# Patient Record
Sex: Male | Born: 1955
Health system: Southern US, Community
[De-identification: ages and names within clinical notes are randomized; demographics above are authoritative.]

---

## 2000-11-04 ENCOUNTER — Ambulatory Visit (HOSPITAL_BASED_OUTPATIENT_CLINIC_OR_DEPARTMENT_OTHER): Admission: RE | Admit: 2000-11-04 | Discharge: 2000-11-04 | Payer: Self-pay | Admitting: Surgery

## 2003-11-14 ENCOUNTER — Emergency Department (HOSPITAL_COMMUNITY): Admission: EM | Admit: 2003-11-14 | Discharge: 2003-11-15 | Payer: Self-pay | Admitting: *Deleted

## 2013-04-26 ENCOUNTER — Encounter: Payer: Self-pay | Admitting: Podiatry

## 2013-04-26 ENCOUNTER — Ambulatory Visit (INDEPENDENT_AMBULATORY_CARE_PROVIDER_SITE_OTHER): Payer: BC Managed Care – PPO | Admitting: Podiatry

## 2013-04-26 VITALS — BP 162/80 | HR 76 | Resp 12 | Ht 71.0 in | Wt 190.0 lb

## 2013-04-26 DIAGNOSIS — B351 Tinea unguium: Secondary | ICD-10-CM

## 2013-04-26 NOTE — Progress Notes (Signed)
N DISCOLORATION L   B/L FOOT  D  LONG TERM O  SLOWLY C  WORSE A  N/A T  N/A

## 2013-04-26 NOTE — Progress Notes (Signed)
Subjective:     Patient ID: Ethan Roberts, male   DOB: 1956-05-15, 57 y.o.   MRN: [id number]  HPI patient presents stating I have for nails that have yellow discoloration. Points to the big toenails of both feet the third right and the fifth left. States it's been going on for a number of years   Review of Systems  All other systems reviewed and are negative.       Objective:   Physical Exam  Nursing note and vitals reviewed. Constitutional: He is oriented to person, place, and time.  Cardiovascular: Intact distal pulses.   Musculoskeletal: Normal range of motion.  Neurological: He is oriented to person, place, and time.  Skin: Skin is warm.   patient is noted to have nail disease with thickness hallux bilateral Danek disease third right and thickness of the fifth left     Assessment:     Mycotic nail infection x4 nails with probable trauma as initial cause    Plan:     H&P performed and formulas 3 dispensed to patient. Discussed laser treatment and explained the chances of improvement associated with this. Patient wants laser and is scheduled for laser treatment

## 2013-05-03 ENCOUNTER — Encounter: Payer: Self-pay | Admitting: Podiatry

## 2013-05-03 ENCOUNTER — Ambulatory Visit (INDEPENDENT_AMBULATORY_CARE_PROVIDER_SITE_OTHER): Payer: BC Managed Care – PPO | Admitting: Podiatry

## 2013-05-03 VITALS — BP 147/99 | HR 78 | Resp 20 | Ht 71.0 in | Wt 190.0 lb

## 2013-05-03 DIAGNOSIS — B351 Tinea unguium: Secondary | ICD-10-CM

## 2013-05-03 NOTE — Progress Notes (Signed)
Subjective:     Patient ID: Ethan Roberts, male   DOB: 18-Apr-1956, 57 y.o.   MRN: [id number]  HPI patient presents for laser of 4 toenails 2 on each feet   Review of Systems     Objective:   Physical Exam     Assessment:     Mycotic nail infection first fifth left first her right    Plan:     Laser therapy of 4 nails. Approximate 2,200 pulse performed

## 2013-07-12 ENCOUNTER — Ambulatory Visit: Payer: BC Managed Care – PPO | Admitting: Podiatry

## 2013-07-24 ENCOUNTER — Ambulatory Visit (INDEPENDENT_AMBULATORY_CARE_PROVIDER_SITE_OTHER): Payer: BC Managed Care – PPO | Admitting: Podiatry

## 2013-07-24 ENCOUNTER — Encounter: Payer: Self-pay | Admitting: Podiatry

## 2013-07-24 VITALS — BP 137/90 | HR 82 | Resp 16

## 2013-07-24 DIAGNOSIS — B351 Tinea unguium: Secondary | ICD-10-CM

## 2013-07-24 NOTE — Progress Notes (Signed)
Subjective:     Patient ID: Ethan Roberts, male   DOB: 23-Dec-1955, 58 y.o.   MRN: [id number]  HPI I'm here for laser I am doing better   Review of Systems     Objective:   Physical Exam Neurovascular status intact significant diminishment of discoloration big toenail both feet    Assessment:     Improved from laser therapy    Plan:     Continue topical and re\re lasered today with no problems

## 2013-10-05 DIAGNOSIS — B351 Tinea unguium: Secondary | ICD-10-CM

## 2013-11-22 ENCOUNTER — Encounter: Payer: Self-pay | Admitting: Podiatry

## 2013-11-22 ENCOUNTER — Ambulatory Visit (INDEPENDENT_AMBULATORY_CARE_PROVIDER_SITE_OTHER): Payer: BC Managed Care – PPO | Admitting: Podiatry

## 2013-11-22 DIAGNOSIS — B351 Tinea unguium: Secondary | ICD-10-CM

## 2013-11-22 NOTE — Progress Notes (Signed)
Subjective:     Patient ID: Ethan Roberts, male   DOB: [birth year], 58 y.o.   MRN: [id number]  HPI patient states it seems to be doing better   Review of Systems     Objective:   Physical Exam Nails are improving with continued mild discoloration of the right hallux nail distal one half    Assessment:     Improved fungal component with laser    Plan:     Laser to the fifth left hallux bilateral tolerated well

## 2015-05-13 ENCOUNTER — Other Ambulatory Visit: Payer: Self-pay | Admitting: Surgery

## 2015-05-13 NOTE — H&P (Signed)
Brynden L. Thackeray 05/13/2015 8:49 AM Location: [facility] Patient #: [id number] DOB: 1956/05/28 Married / Language: English / Race: White Male  History of Present Illness Adin Hector MD; 05/13/2015 9:22 AM) Patient words: lipoma on back.  The patient is a 59 year old male who presents with a soft tissue mass. Patient sent by his primary care physician, Dr. Antony Contras, for concern of enlarging back mass.  Pleasant active male. Has had a mass on his back for many years. His wife noticed and was concerned. She recommend he consider getting it removed. He discuss with his primary care physician whom offered surgical consultation. Patient denies any history of fall or trauma. He noticed a lump there for almost a decade. He thinks it's Slightly Larger. He Does Exercise with Cardio. He Does Play Golf. Smokes a Couple Cigars a Month at Most. He Had an Umbilical Hernia Repair by Dr Nedra Hai [facility] a few Years Ago. Vasectomy by Dr. Jacqualin Combes with Urology Years Ago. No Other Surgeries. No Lumps or Bumps or Other Problems. He denies any history of fall or trauma. No prior back surgery. No history of skin infections. No abscesses. No infections. No history of neurofibromatosis.   Other Problems Elbert Ewings, CMA; 05/13/2015 8:49 AM) Back Pain Gastroesophageal Reflux Disease Migraine Headache Umbilical Hernia Repair  Past Surgical History Elbert Ewings, CMA; 05/13/2015 8:49 AM) Oral Surgery Ventral / Umbilical Hernia Surgery Right.  Diagnostic Studies History Elbert Ewings, [place]; 05/13/2015 8:49 AM) Colonoscopy 1-5 years ago  Allergies Elbert Ewings, CMA; 05/13/2015 8:49 AM) Penicillin VK *PENICILLINS*  Medication History Elbert Ewings, CMA; 05/13/2015 8:49 AM) Multiple Vitamin (Oral) Active. Ambien CR (12.5MG  Tablet ER, Oral) Active. Medications Reconciled  Social History Elbert Ewings, [place]; 05/13/2015 8:49 AM) Alcohol use Moderate alcohol  use. Caffeine use Coffee. No drug use Tobacco use Current some day smoker.  Family History Elbert Ewings, [place]; 05/13/2015 8:49 AM) Arthritis Mother. Cancer Father. Cerebrovascular Accident Father. Colon Polyps Father. Depression Father, Mother, Sister. Diabetes Mellitus Mother.     Review of Systems Elbert Ewings CMA; 05/13/2015 8:49 AM) General Not Present- Appetite Loss, Chills, Fatigue, Fever, Night Sweats, Weight Gain and Weight Loss. Skin Not Present- Change in Wart/Mole, Dryness, Hives, Jaundice, New Lesions, Non-Healing Wounds, Rash and Ulcer. HEENT Present- Ringing in the Ears. Not Present- Earache, Hearing Loss, Hoarseness, Nose Bleed, Oral Ulcers, Seasonal Allergies, Sinus Pain, Sore Throat, Visual Disturbances, Wears glasses/contact lenses and Yellow Eyes. Respiratory Present- Snoring. Not Present- Bloody sputum, Chronic Cough, Difficulty Breathing and Wheezing. Breast Not Present- Breast Mass, Breast Pain, Nipple Discharge and Skin Changes. Cardiovascular Not Present- Chest Pain, Difficulty Breathing Lying Down, Leg Cramps, Palpitations, Rapid Heart Rate, Shortness of Breath and Swelling of Extremities. Gastrointestinal Not Present- Abdominal Pain, Bloating, Bloody Stool, Change in Bowel Habits, Chronic diarrhea, Constipation, Difficulty Swallowing, Excessive gas, Gets full quickly at meals, Hemorrhoids, Indigestion, Nausea, Rectal Pain and Vomiting. Musculoskeletal Not Present- Back Pain, Joint Pain, Joint Stiffness, Muscle Pain, Muscle Weakness and Swelling of Extremities. Neurological Not Present- Decreased Memory, Fainting, Headaches, Numbness, Seizures, Tingling, Tremor, Trouble walking and Weakness. Psychiatric Not Present- Anxiety, Bipolar, Change in Sleep Pattern, Depression, Fearful and Frequent crying. Endocrine Not Present- Cold Intolerance, Excessive Hunger, Hair Changes, Heat Intolerance, Hot flashes and New Diabetes. Hematology Not Present- Easy Bruising,  Excessive bleeding, Gland problems, HIV and Persistent Infections.  Vitals Elbert Ewings CMA; 05/13/2015 8:50 AM) 05/13/2015 8:50 AM Weight: 205 lb Height: 71in Body Surface Area: 2.13 m Body Mass Index: 28.59  kg/m  Temp.: 98.8F(Temporal)  Pulse: 80 (Regular)  BP: 130/70 (Sitting, Left Arm, Standard)      Physical Exam Adin Hector MD; 05/13/2015 9:15 AM)  General Mental Status-Alert. General Appearance-Not in acute distress, Not Sickly. Orientation-Oriented X3. Hydration-Well hydrated. Voice-Normal.  Integumentary Global Assessment Upon inspection and palpation of skin surfaces of the - Axillae: non-tender, no inflammation or ulceration, no drainage. and Distribution of scalp and body hair is normal. General Characteristics Temperature - normal warmth is noted.  Head and Neck Head-normocephalic, atraumatic with no lesions or palpable masses. Face Global Assessment - atraumatic, no absence of expression. Neck Global Assessment - no abnormal movements, no bruit auscultated on the right, no bruit auscultated on the left, no decreased range of motion, non-tender. Trachea-midline. Thyroid Gland Characteristics - non-tender.  Eye Eyeball - Left-Extraocular movements intact, No Nystagmus. Eyeball - Right-Extraocular movements intact, No Nystagmus. Cornea - Left-No Hazy. Cornea - Right-No Hazy. Sclera/Conjunctiva - Left-No scleral icterus, No Discharge. Sclera/Conjunctiva - Right-No scleral icterus, No Discharge. Pupil - Left-Direct reaction to light normal. Pupil - Right-Direct reaction to light normal.  ENMT Ears Pinna - Left - no drainage observed, no generalized tenderness observed. Right - no drainage observed, no generalized tenderness observed. Nose and Sinuses External Inspection of the Nose - no destructive lesion observed. Inspection of the nares - Left - quiet respiration. Right - quiet respiration. Mouth and  Throat Lips - Upper Lip - no fissures observed, no pallor noted. Lower Lip - no fissures observed, no pallor noted. Nasopharynx - no discharge present. Oral Cavity/Oropharynx - Tongue - no dryness observed. Oral Mucosa - no cyanosis observed. Hypopharynx - no evidence of airway distress observed.  Chest and Lung Exam Inspection Movements - Normal and Symmetrical. Accessory muscles - No use of accessory muscles in breathing. Palpation Palpation of the chest reveals - Non-tender. Auscultation Breath sounds - Normal and Clear.  Cardiovascular Auscultation Rhythm - Regular. Murmurs & Other Heart Sounds - Auscultation of the heart reveals - No Murmurs and No Systolic Clicks.  Abdomen Inspection Inspection of the abdomen reveals - No Visible peristalsis and No Abnormal pulsations. Umbilicus - No Bleeding, No Urine drainage. Palpation/Percussion Palpation and Percussion of the abdomen reveal - Soft, Non Tender, No Rebound tenderness, No Rigidity (guarding) and No Cutaneous hyperesthesia.  Male Genitourinary Sexual Maturity Tanner 5 - Adult hair pattern and Adult penile size and shape.  Peripheral Vascular Upper Extremity Inspection - Left - No Cyanotic nailbeds, Not Ischemic. Right - No Cyanotic nailbeds, Not Ischemic.  Neurologic Neurologic evaluation reveals -normal attention span and ability to concentrate, able to name objects and repeat phrases. Appropriate fund of knowledge , normal sensation and normal coordination. Mental Status Affect - not angry, not paranoid. Cranial Nerves-Normal Bilaterally. Gait-Normal.  Neuropsychiatric Mental status exam performed with findings of-able to articulate well with normal speech/language, rate, volume and coherence, thought content normal with ability to perform basic computations and apply abstract reasoning and no evidence of hallucinations, delusions, obsessions or homicidal/suicidal ideation.  Musculoskeletal Global  Assessment Spine, Ribs and Pelvis - no instability, subluxation or laxity. Right Upper Extremity - no instability, subluxation or laxity. Note: 5x4cm ellipsoid soft tissue mass in LEFT paramedian thoracic back. No opening. No fluctuance. No pain or tenderness.  Lymphatic Head & Neck  General Head & Neck Lymphatics: Bilateral - Description - No Localized lymphadenopathy. Axillary  General Axillary Region: Bilateral - Description - No Localized lymphadenopathy. Femoral & Inguinal  Generalized Femoral & Inguinal Lymphatics: Left - Description -  No Localized lymphadenopathy. Right - Description - No Localized lymphadenopathy.    Assessment & Plan Adin Hector MD; 05/13/2015 9:22 AM)  MASS OF SUBCUTANEOUS TISSUE OF BACK (R22.2) Impression: Slowly enlarging mass of subcutaneous tissues of back. I favor lipoma over cyst. It has slightly gotten larger over the years.  Because of its change in size, I offered removal. My suspicion of malignancy is relatively low but never zero. He did confess that that was a concern for him. Because of his wife's concern in his conservative change in size, he wishes to proceed with excision. Given the location of one at least some sedation a decubitus position given its fixed nature on the back. Should be outpatient surgery. Possibility of drain needed post-op but hopefully not too likely given the fact its less than 5 cm.  Current Plans You are being scheduled for surgery - Our schedulers will call you.  You should hear from our office's scheduling department within 5 working days about the location, date, and time of surgery. We try to make accommodations for patient's preferences in scheduling surgery, but sometimes the OR schedule or the surgeon's schedule prevents [place] from making those accommodations.  If you have not heard from our office [phone]) in 5 working days, call the office and ask for your surgeon's nurse.  If you have other questions  about your diagnosis, plan, or surgery, call the office and ask for your surgeon's nurse.  The pathophysiology of skin & subcutaneous masses was discussed. Natural history risks without surgery were discussed. I recommended surgery to remove the mass. I explained the technique of removal with use of local anesthesia & possible need for more aggressive sedation/anesthesia for patient comfort.  Risks such as bleeding, infection, wound breakdown, heart attack, death, and other risks were discussed. I noted a good likelihood this will help address the problem. Possibility that this will not correct all symptoms was explained. Possibility of regrowth/recurrence of the mass was discussed. We will work to minimize complications. Questions were answered. The patient expresses understanding & wishes to proceed with surgery.  Pt Education - [facility] General Post-op HCI Pt Education - [facility] - General recommendations  Adin Hector, M.D., F.A.C.S. Gastrointestinal and Minimally Invasive Surgery [facility] [address] [place], [place] [address] [phone] Main / Paging

## 2015-11-11 DIAGNOSIS — J309 Allergic rhinitis, unspecified: Secondary | ICD-10-CM | POA: Diagnosis not present

## 2015-11-11 DIAGNOSIS — R05 Cough: Secondary | ICD-10-CM | POA: Diagnosis not present

## 2015-12-23 DIAGNOSIS — L565 Disseminated superficial actinic porokeratosis (DSAP): Secondary | ICD-10-CM | POA: Diagnosis not present

## 2015-12-23 DIAGNOSIS — L570 Actinic keratosis: Secondary | ICD-10-CM | POA: Diagnosis not present

## 2015-12-23 DIAGNOSIS — D1801 Hemangioma of skin and subcutaneous tissue: Secondary | ICD-10-CM | POA: Diagnosis not present

## 2015-12-23 DIAGNOSIS — D3612 Benign neoplasm of peripheral nerves and autonomic nervous system, upper limb, including shoulder: Secondary | ICD-10-CM | POA: Diagnosis not present

## 2015-12-23 DIAGNOSIS — L821 Other seborrheic keratosis: Secondary | ICD-10-CM | POA: Diagnosis not present

## 2016-08-04 DIAGNOSIS — Z0000 Encounter for general adult medical examination without abnormal findings: Secondary | ICD-10-CM | POA: Diagnosis not present

## 2016-08-04 DIAGNOSIS — E7800 Pure hypercholesterolemia, unspecified: Secondary | ICD-10-CM | POA: Diagnosis not present

## 2016-08-04 DIAGNOSIS — I10 Essential (primary) hypertension: Secondary | ICD-10-CM | POA: Diagnosis not present

## 2016-08-04 DIAGNOSIS — G4700 Insomnia, unspecified: Secondary | ICD-10-CM | POA: Diagnosis not present

## 2016-08-04 DIAGNOSIS — Z125 Encounter for screening for malignant neoplasm of prostate: Secondary | ICD-10-CM | POA: Diagnosis not present

## 2016-08-04 DIAGNOSIS — R7303 Prediabetes: Secondary | ICD-10-CM | POA: Diagnosis not present

## 2016-08-04 DIAGNOSIS — N529 Male erectile dysfunction, unspecified: Secondary | ICD-10-CM | POA: Diagnosis not present

## 2016-10-30 DIAGNOSIS — D0439 Carcinoma in situ of skin of other parts of face: Secondary | ICD-10-CM | POA: Diagnosis not present

## 2016-10-30 DIAGNOSIS — L821 Other seborrheic keratosis: Secondary | ICD-10-CM | POA: Diagnosis not present

## 2016-10-30 DIAGNOSIS — L570 Actinic keratosis: Secondary | ICD-10-CM | POA: Diagnosis not present

## 2016-11-24 DIAGNOSIS — C44319 Basal cell carcinoma of skin of other parts of face: Secondary | ICD-10-CM | POA: Diagnosis not present

## 2016-11-27 DIAGNOSIS — H524 Presbyopia: Secondary | ICD-10-CM | POA: Diagnosis not present

## 2016-12-29 DIAGNOSIS — L820 Inflamed seborrheic keratosis: Secondary | ICD-10-CM | POA: Diagnosis not present

## 2016-12-29 DIAGNOSIS — D3612 Benign neoplasm of peripheral nerves and autonomic nervous system, upper limb, including shoulder: Secondary | ICD-10-CM | POA: Diagnosis not present

## 2016-12-29 DIAGNOSIS — Z85828 Personal history of other malignant neoplasm of skin: Secondary | ICD-10-CM | POA: Diagnosis not present

## 2016-12-29 DIAGNOSIS — L570 Actinic keratosis: Secondary | ICD-10-CM | POA: Diagnosis not present

## 2016-12-29 DIAGNOSIS — D225 Melanocytic nevi of trunk: Secondary | ICD-10-CM | POA: Diagnosis not present

## 2016-12-29 DIAGNOSIS — D2261 Melanocytic nevi of right upper limb, including shoulder: Secondary | ICD-10-CM | POA: Diagnosis not present

## 2017-02-04 DIAGNOSIS — L570 Actinic keratosis: Secondary | ICD-10-CM | POA: Diagnosis not present

## 2017-02-04 DIAGNOSIS — Z85828 Personal history of other malignant neoplasm of skin: Secondary | ICD-10-CM | POA: Diagnosis not present

## 2017-02-04 DIAGNOSIS — L821 Other seborrheic keratosis: Secondary | ICD-10-CM | POA: Diagnosis not present

## 2017-08-10 DIAGNOSIS — N529 Male erectile dysfunction, unspecified: Secondary | ICD-10-CM | POA: Diagnosis not present

## 2017-08-10 DIAGNOSIS — R7303 Prediabetes: Secondary | ICD-10-CM | POA: Diagnosis not present

## 2017-08-10 DIAGNOSIS — Z23 Encounter for immunization: Secondary | ICD-10-CM | POA: Diagnosis not present

## 2017-08-10 DIAGNOSIS — Z125 Encounter for screening for malignant neoplasm of prostate: Secondary | ICD-10-CM | POA: Diagnosis not present

## 2017-08-10 DIAGNOSIS — G4700 Insomnia, unspecified: Secondary | ICD-10-CM | POA: Diagnosis not present

## 2017-08-10 DIAGNOSIS — Z0000 Encounter for general adult medical examination without abnormal findings: Secondary | ICD-10-CM | POA: Diagnosis not present

## 2017-08-10 DIAGNOSIS — E7800 Pure hypercholesterolemia, unspecified: Secondary | ICD-10-CM | POA: Diagnosis not present

## 2017-08-10 DIAGNOSIS — I10 Essential (primary) hypertension: Secondary | ICD-10-CM | POA: Diagnosis not present

## 2017-12-13 DIAGNOSIS — H5203 Hypermetropia, bilateral: Secondary | ICD-10-CM | POA: Diagnosis not present

## 2018-01-04 DIAGNOSIS — L570 Actinic keratosis: Secondary | ICD-10-CM | POA: Diagnosis not present

## 2018-01-04 DIAGNOSIS — L821 Other seborrheic keratosis: Secondary | ICD-10-CM | POA: Diagnosis not present

## 2018-01-04 DIAGNOSIS — L812 Freckles: Secondary | ICD-10-CM | POA: Diagnosis not present

## 2018-01-04 DIAGNOSIS — L565 Disseminated superficial actinic porokeratosis (DSAP): Secondary | ICD-10-CM | POA: Diagnosis not present

## 2018-01-04 DIAGNOSIS — D225 Melanocytic nevi of trunk: Secondary | ICD-10-CM | POA: Diagnosis not present

## 2018-03-14 DIAGNOSIS — D0321 Melanoma in situ of right ear and external auricular canal: Secondary | ICD-10-CM | POA: Diagnosis not present

## 2018-03-14 DIAGNOSIS — L570 Actinic keratosis: Secondary | ICD-10-CM | POA: Diagnosis not present

## 2018-04-25 DIAGNOSIS — D0321 Melanoma in situ of right ear and external auricular canal: Secondary | ICD-10-CM | POA: Diagnosis not present

## 2018-06-15 DIAGNOSIS — J069 Acute upper respiratory infection, unspecified: Secondary | ICD-10-CM | POA: Diagnosis not present

## 2018-06-15 DIAGNOSIS — J029 Acute pharyngitis, unspecified: Secondary | ICD-10-CM | POA: Diagnosis not present

## 2018-08-16 DIAGNOSIS — R7303 Prediabetes: Secondary | ICD-10-CM | POA: Diagnosis not present

## 2018-08-16 DIAGNOSIS — I10 Essential (primary) hypertension: Secondary | ICD-10-CM | POA: Diagnosis not present

## 2018-08-16 DIAGNOSIS — E7800 Pure hypercholesterolemia, unspecified: Secondary | ICD-10-CM | POA: Diagnosis not present

## 2018-08-16 DIAGNOSIS — Z0000 Encounter for general adult medical examination without abnormal findings: Secondary | ICD-10-CM | POA: Diagnosis not present

## 2018-08-16 DIAGNOSIS — Z23 Encounter for immunization: Secondary | ICD-10-CM | POA: Diagnosis not present

## 2018-08-16 DIAGNOSIS — N529 Male erectile dysfunction, unspecified: Secondary | ICD-10-CM | POA: Diagnosis not present

## 2018-08-16 DIAGNOSIS — G4700 Insomnia, unspecified: Secondary | ICD-10-CM | POA: Diagnosis not present

## 2018-08-16 DIAGNOSIS — Z125 Encounter for screening for malignant neoplasm of prostate: Secondary | ICD-10-CM | POA: Diagnosis not present

## 2018-08-24 DIAGNOSIS — R6889 Other general symptoms and signs: Secondary | ICD-10-CM | POA: Diagnosis not present

## 2018-08-24 DIAGNOSIS — J111 Influenza due to unidentified influenza virus with other respiratory manifestations: Secondary | ICD-10-CM | POA: Diagnosis not present

## 2018-08-24 DIAGNOSIS — J029 Acute pharyngitis, unspecified: Secondary | ICD-10-CM | POA: Diagnosis not present

## 2018-11-03 DIAGNOSIS — L812 Freckles: Secondary | ICD-10-CM | POA: Diagnosis not present

## 2018-11-03 DIAGNOSIS — D1801 Hemangioma of skin and subcutaneous tissue: Secondary | ICD-10-CM | POA: Diagnosis not present

## 2018-11-03 DIAGNOSIS — D225 Melanocytic nevi of trunk: Secondary | ICD-10-CM | POA: Diagnosis not present

## 2018-11-03 DIAGNOSIS — L570 Actinic keratosis: Secondary | ICD-10-CM | POA: Diagnosis not present

## 2019-03-01 DIAGNOSIS — H524 Presbyopia: Secondary | ICD-10-CM | POA: Diagnosis not present

## 2019-03-01 DIAGNOSIS — H5203 Hypermetropia, bilateral: Secondary | ICD-10-CM | POA: Diagnosis not present

## 2019-03-01 DIAGNOSIS — H52203 Unspecified astigmatism, bilateral: Secondary | ICD-10-CM | POA: Diagnosis not present

## 2019-04-12 ENCOUNTER — Other Ambulatory Visit: Payer: Self-pay

## 2019-04-12 DIAGNOSIS — Z20822 Contact with and (suspected) exposure to covid-19: Secondary | ICD-10-CM

## 2019-04-13 LAB — NOVEL CORONAVIRUS, NAA: SARS-CoV-2, NAA: NOT DETECTED

## 2019-05-10 DIAGNOSIS — L570 Actinic keratosis: Secondary | ICD-10-CM | POA: Diagnosis not present

## 2019-05-10 DIAGNOSIS — D2261 Melanocytic nevi of right upper limb, including shoulder: Secondary | ICD-10-CM | POA: Diagnosis not present

## 2019-05-10 DIAGNOSIS — D225 Melanocytic nevi of trunk: Secondary | ICD-10-CM | POA: Diagnosis not present

## 2019-05-10 DIAGNOSIS — L812 Freckles: Secondary | ICD-10-CM | POA: Diagnosis not present

## 2019-05-10 DIAGNOSIS — D2371 Other benign neoplasm of skin of right lower limb, including hip: Secondary | ICD-10-CM | POA: Diagnosis not present

## 2019-06-30 DIAGNOSIS — Z20822 Contact with and (suspected) exposure to covid-19: Secondary | ICD-10-CM | POA: Diagnosis not present

## 2019-09-18 DIAGNOSIS — N529 Male erectile dysfunction, unspecified: Secondary | ICD-10-CM | POA: Diagnosis not present

## 2019-09-18 DIAGNOSIS — Z125 Encounter for screening for malignant neoplasm of prostate: Secondary | ICD-10-CM | POA: Diagnosis not present

## 2019-09-18 DIAGNOSIS — I10 Essential (primary) hypertension: Secondary | ICD-10-CM | POA: Diagnosis not present

## 2019-09-18 DIAGNOSIS — R7303 Prediabetes: Secondary | ICD-10-CM | POA: Diagnosis not present

## 2019-09-18 DIAGNOSIS — G4700 Insomnia, unspecified: Secondary | ICD-10-CM | POA: Diagnosis not present

## 2019-09-18 DIAGNOSIS — E7800 Pure hypercholesterolemia, unspecified: Secondary | ICD-10-CM | POA: Diagnosis not present

## 2019-09-18 DIAGNOSIS — Z0000 Encounter for general adult medical examination without abnormal findings: Secondary | ICD-10-CM | POA: Diagnosis not present

## 2019-09-21 DIAGNOSIS — E875 Hyperkalemia: Secondary | ICD-10-CM | POA: Diagnosis not present

## 2019-10-24 DIAGNOSIS — Z23 Encounter for immunization: Secondary | ICD-10-CM | POA: Diagnosis not present

## 2019-11-08 DIAGNOSIS — L820 Inflamed seborrheic keratosis: Secondary | ICD-10-CM | POA: Diagnosis not present

## 2019-11-08 DIAGNOSIS — D2261 Melanocytic nevi of right upper limb, including shoulder: Secondary | ICD-10-CM | POA: Diagnosis not present

## 2019-11-08 DIAGNOSIS — D225 Melanocytic nevi of trunk: Secondary | ICD-10-CM | POA: Diagnosis not present

## 2019-11-08 DIAGNOSIS — D2372 Other benign neoplasm of skin of left lower limb, including hip: Secondary | ICD-10-CM | POA: Diagnosis not present

## 2019-11-08 DIAGNOSIS — L570 Actinic keratosis: Secondary | ICD-10-CM | POA: Diagnosis not present

## 2019-11-08 DIAGNOSIS — D2262 Melanocytic nevi of left upper limb, including shoulder: Secondary | ICD-10-CM | POA: Diagnosis not present

## 2020-02-23 DIAGNOSIS — Z23 Encounter for immunization: Secondary | ICD-10-CM | POA: Diagnosis not present

## 2020-03-28 DIAGNOSIS — Z20828 Contact with and (suspected) exposure to other viral communicable diseases: Secondary | ICD-10-CM | POA: Diagnosis not present

## 2020-04-05 ENCOUNTER — Other Ambulatory Visit: Payer: Self-pay

## 2020-04-05 ENCOUNTER — Encounter (INDEPENDENT_AMBULATORY_CARE_PROVIDER_SITE_OTHER): Payer: Self-pay | Admitting: Otolaryngology

## 2020-04-05 ENCOUNTER — Ambulatory Visit (INDEPENDENT_AMBULATORY_CARE_PROVIDER_SITE_OTHER): Payer: BLUE CROSS/BLUE SHIELD | Admitting: Otolaryngology

## 2020-04-05 VITALS — Temp 97.3°F

## 2020-04-05 DIAGNOSIS — H608X3 Other otitis externa, bilateral: Secondary | ICD-10-CM | POA: Diagnosis not present

## 2020-04-05 NOTE — Progress Notes (Signed)
HPI: Ethan Roberts is a 64 y.o. male who presents for evaluation of itching and scaling in his ears for the past 1-1/2 to 2 months.  He has tried Neosporin.  He has drainage sometimes.  The ears have been itching.  He has not noted any hearing problems.  He is having some cracking of the skin with little bit of soreness on the right side.  No past medical history on file. No past surgical history on file. Social History   Socioeconomic History   Marital status: Married    Spouse name: Not on file   Number of children: Not on file   Years of education: Not on file   Highest education level: Not on file  Occupational History   Not on file  Tobacco Use   Smoking status: Current Some Day Smoker    Types: Cigars   Smokeless tobacco: Never Used   Tobacco comment: 1 cigar a week  Substance and Sexual Activity   Alcohol use: Yes    Comment: a glass of wine 4 out of 7 days   Drug use: No   Sexual activity: Not on file  Other Topics Concern   Not on file  Social History Narrative   Not on file   Social Determinants of Health   Financial Resource Strain:    Difficulty of Paying Living Expenses: Not on file  Food Insecurity:    Worried About [place] in the Last Year: Not on file   Ran Out of Food in the Last Year: Not on file  Transportation Needs:    Lack of Transportation (Medical): Not on file   Lack of Transportation (Non-Medical): Not on file  Physical Activity:    Days of Exercise per Week: Not on file   Minutes of Exercise per Session: Not on file  Stress:    Feeling of Stress : Not on file  Social Connections:    Frequency of Communication with Friends and Family: Not on file   Frequency of Social Gatherings with Friends and Family: Not on file   Attends Religious Services: Not on file   Active Member of Clubs or Organizations: Not on file   Attends Archivist Meetings: Not on file   Marital Status: Not on file   No  family history on file. Allergies  Allergen Reactions   Penicillins    Prior to Admission medications   Medication Sig Start Date End Date Taking? Authorizing Provider  Multiple Vitamin (MULTIVITAMIN) tablet Take 1 tablet by mouth daily.   Yes [provider]  zolpidem (AMBIEN CR) 12.5 MG CR tablet  04/04/13  Yes [provider]     Positive ROS: Otherwise negative  All other systems have been reviewed and were otherwise negative with the exception of those mentioned in the HPI and as above.  Physical Exam: Constitutional: Alert, well-appearing, no acute distress Ears: External ears without lesions or tenderness.  The outer and lateral portion of both ear canals reveal crusting but no drainage.  He has some cracking of the skin on the right side.  The more medial portion of the ear canals are clear with no drainage and no evidence of external otitis medial within the ear canal.  The TMs are clear bilaterally.  I applied gentian violet Ciprodex and CSF powder to the lateral portion of both ear canals. Nasal: External nose without lesions. Clear nasal passages Oral: Lips and gums without lesions. Tongue and palate mucosa without lesions.  Posterior oropharynx clear. Neck: No palpable adenopathy or masses Respiratory: Breathing comfortably  Skin: No facial/neck lesions or rash noted.  Procedures  Assessment: Eczematoid type changes of the lateral ear canal on both sides.  Extends into the concha.  Plan: Recommend keeping it dry tonight in prescribed Diprolene 0.05% cream to apply to the area twice daily for a week.  Also gave her a prescription for mupirocin 2% ointment to use if he develops any pain.  He will notify us if symptoms are not significantly improved within a week.  Radene Journey, MD

## 2020-05-09 DIAGNOSIS — D225 Melanocytic nevi of trunk: Secondary | ICD-10-CM | POA: Diagnosis not present

## 2020-05-09 DIAGNOSIS — D1801 Hemangioma of skin and subcutaneous tissue: Secondary | ICD-10-CM | POA: Diagnosis not present

## 2020-05-09 DIAGNOSIS — L570 Actinic keratosis: Secondary | ICD-10-CM | POA: Diagnosis not present

## 2020-05-09 DIAGNOSIS — L812 Freckles: Secondary | ICD-10-CM | POA: Diagnosis not present

## 2020-05-09 DIAGNOSIS — L245 Irritant contact dermatitis due to other chemical products: Secondary | ICD-10-CM | POA: Diagnosis not present

## 2020-09-18 DIAGNOSIS — L308 Other specified dermatitis: Secondary | ICD-10-CM | POA: Diagnosis not present

## 2020-10-03 DIAGNOSIS — Z0000 Encounter for general adult medical examination without abnormal findings: Secondary | ICD-10-CM | POA: Diagnosis not present

## 2020-10-03 DIAGNOSIS — Z125 Encounter for screening for malignant neoplasm of prostate: Secondary | ICD-10-CM | POA: Diagnosis not present

## 2020-10-03 DIAGNOSIS — R7303 Prediabetes: Secondary | ICD-10-CM | POA: Diagnosis not present

## 2020-10-03 DIAGNOSIS — E7800 Pure hypercholesterolemia, unspecified: Secondary | ICD-10-CM | POA: Diagnosis not present

## 2020-10-03 DIAGNOSIS — I10 Essential (primary) hypertension: Secondary | ICD-10-CM | POA: Diagnosis not present

## 2020-10-03 DIAGNOSIS — G4700 Insomnia, unspecified: Secondary | ICD-10-CM | POA: Diagnosis not present

## 2020-10-03 DIAGNOSIS — N529 Male erectile dysfunction, unspecified: Secondary | ICD-10-CM | POA: Diagnosis not present

## 2020-11-19 DIAGNOSIS — Z85828 Personal history of other malignant neoplasm of skin: Secondary | ICD-10-CM | POA: Diagnosis not present

## 2020-11-19 DIAGNOSIS — L820 Inflamed seborrheic keratosis: Secondary | ICD-10-CM | POA: Diagnosis not present

## 2020-11-19 DIAGNOSIS — L821 Other seborrheic keratosis: Secondary | ICD-10-CM | POA: Diagnosis not present

## 2020-11-19 DIAGNOSIS — Z85820 Personal history of malignant melanoma of skin: Secondary | ICD-10-CM | POA: Diagnosis not present

## 2020-11-19 DIAGNOSIS — D2261 Melanocytic nevi of right upper limb, including shoulder: Secondary | ICD-10-CM | POA: Diagnosis not present

## 2020-11-21 DIAGNOSIS — H25013 Cortical age-related cataract, bilateral: Secondary | ICD-10-CM | POA: Diagnosis not present

## 2020-11-21 DIAGNOSIS — H524 Presbyopia: Secondary | ICD-10-CM | POA: Diagnosis not present

## 2020-11-21 DIAGNOSIS — R7303 Prediabetes: Secondary | ICD-10-CM | POA: Diagnosis not present

## 2020-11-21 DIAGNOSIS — H2513 Age-related nuclear cataract, bilateral: Secondary | ICD-10-CM | POA: Diagnosis not present

## 2020-11-21 DIAGNOSIS — H5203 Hypermetropia, bilateral: Secondary | ICD-10-CM | POA: Diagnosis not present

## 2020-11-21 DIAGNOSIS — H52201 Unspecified astigmatism, right eye: Secondary | ICD-10-CM | POA: Diagnosis not present

## 2020-12-24 DIAGNOSIS — L820 Inflamed seborrheic keratosis: Secondary | ICD-10-CM | POA: Diagnosis not present

## 2021-01-03 DIAGNOSIS — K648 Other hemorrhoids: Secondary | ICD-10-CM | POA: Diagnosis not present

## 2021-01-03 DIAGNOSIS — Z8371 Family history of colonic polyps: Secondary | ICD-10-CM | POA: Diagnosis not present

## 2021-01-03 DIAGNOSIS — Z1211 Encounter for screening for malignant neoplasm of colon: Secondary | ICD-10-CM | POA: Diagnosis not present

## 2021-05-27 DIAGNOSIS — L812 Freckles: Secondary | ICD-10-CM | POA: Diagnosis not present

## 2021-05-27 DIAGNOSIS — D225 Melanocytic nevi of trunk: Secondary | ICD-10-CM | POA: Diagnosis not present

## 2021-05-27 DIAGNOSIS — L820 Inflamed seborrheic keratosis: Secondary | ICD-10-CM | POA: Diagnosis not present

## 2021-05-27 DIAGNOSIS — L821 Other seborrheic keratosis: Secondary | ICD-10-CM | POA: Diagnosis not present

## 2021-10-22 ENCOUNTER — Encounter (HOSPITAL_BASED_OUTPATIENT_CLINIC_OR_DEPARTMENT_OTHER): Payer: Self-pay | Admitting: Emergency Medicine

## 2021-10-22 ENCOUNTER — Emergency Department (HOSPITAL_BASED_OUTPATIENT_CLINIC_OR_DEPARTMENT_OTHER)
Admission: EM | Admit: 2021-10-22 | Discharge: 2021-10-23 | Disposition: A | Discharge status: Home / Self Care | Payer: Commercial Managed Care - PPO | Attending: Emergency Medicine | Admitting: Emergency Medicine

## 2021-10-22 ENCOUNTER — Other Ambulatory Visit: Payer: Self-pay

## 2021-10-22 ENCOUNTER — Emergency Department (HOSPITAL_BASED_OUTPATIENT_CLINIC_OR_DEPARTMENT_OTHER): Payer: Commercial Managed Care - PPO

## 2021-10-22 DIAGNOSIS — R1032 Left lower quadrant pain: Secondary | ICD-10-CM | POA: Diagnosis present

## 2021-10-22 DIAGNOSIS — D72829 Elevated white blood cell count, unspecified: Secondary | ICD-10-CM | POA: Diagnosis not present

## 2021-10-22 DIAGNOSIS — K529 Noninfective gastroenteritis and colitis, unspecified: Secondary | ICD-10-CM | POA: Insufficient documentation

## 2021-10-22 LAB — URINALYSIS, ROUTINE W REFLEX MICROSCOPIC
Bilirubin Urine: NEGATIVE
Glucose, UA: NEGATIVE mg/dL
Hgb urine dipstick: NEGATIVE
Ketones, ur: 40 mg/dL — AB
Leukocytes,Ua: NEGATIVE
Nitrite: NEGATIVE
Protein, ur: NEGATIVE mg/dL
Specific Gravity, Urine: <=1.005 (ref 1.005–1.030)
pH: 6.5 (ref 5.0–8.0)

## 2021-10-22 LAB — CBC WITH DIFFERENTIAL/PLATELET
Abs Immature Granulocytes: 0.06 10*3/uL (ref 0.00–0.07)
Basophils Absolute: 0.1 10*3/uL (ref 0.0–0.1)
Basophils Relative: 1 %
Eosinophils Absolute: 0.1 10*3/uL (ref 0.0–0.5)
Eosinophils Relative: 1 %
HCT: 47.2 % (ref 39.0–52.0)
Hemoglobin: 16.3 g/dL (ref 13.0–17.0)
Immature Granulocytes: 1 %
Lymphocytes Relative: 10 %
Lymphs Abs: 1.1 10*3/uL (ref 0.7–4.0)
MCH: 29.7 pg (ref 26.0–34.0)
MCHC: 34.5 g/dL (ref 30.0–36.0)
MCV: 86 fL (ref 80.0–100.0)
Monocytes Absolute: 0.4 10*3/uL (ref 0.1–1.0)
Monocytes Relative: 4 %
Neutro Abs: 9.4 10*3/uL — ABNORMAL HIGH (ref 1.7–7.7)
Neutrophils Relative %: 83 %
Platelets: 359 10*3/uL (ref 150–400)
RBC: 5.49 MIL/uL (ref 4.22–5.81)
RDW: 12.7 % (ref 11.5–15.5)
WBC: 11.2 10*3/uL — ABNORMAL HIGH (ref 4.0–10.5)
nRBC: 0 % (ref 0.0–0.2)

## 2021-10-22 LAB — COMPREHENSIVE METABOLIC PANEL
ALT: 27 U/L (ref 0–44)
AST: 28 U/L (ref 15–41)
Albumin: 4.3 g/dL (ref 3.5–5.0)
Alkaline Phosphatase: 69 U/L (ref 38–126)
Anion gap: 10 (ref 5–15)
BUN: 22 mg/dL (ref 8–23)
CO2: 25 mmol/L (ref 22–32)
Calcium: 9.1 mg/dL (ref 8.9–10.3)
Chloride: 102 mmol/L (ref 98–111)
Creatinine, Ser: 1.07 mg/dL (ref 0.61–1.24)
GFR, Estimated: >60 mL/min (ref >60)
Glucose, Bld: 152 mg/dL — ABNORMAL HIGH (ref 70–99)
Potassium: 4.1 mmol/L (ref 3.5–5.1)
Sodium: 137 mmol/L (ref 135–145)
Total Bilirubin: 0.6 mg/dL (ref 0.3–1.2)
Total Protein: 7.5 g/dL (ref 6.5–8.1)

## 2021-10-22 LAB — LIPASE, BLOOD: Lipase: 27 U/L (ref 11–51)

## 2021-10-22 MED ORDER — KETOROLAC TROMETHAMINE 30 MG/ML IJ SOLN
30.0000 mg | Freq: Once | INTRAMUSCULAR | Status: AC
  Administered 2021-10-23: 30 mg via INTRAVENOUS
  Filled 2021-10-22: qty 1

## 2021-10-22 MED ORDER — SODIUM CHLORIDE 0.9 % IV BOLUS
500.0000 mL | Freq: Once | INTRAVENOUS | Status: AC
  Administered 2021-10-23: 500 mL via INTRAVENOUS

## 2021-10-22 MED ORDER — IOHEXOL 300 MG/ML  SOLN
100.0000 mL | Freq: Once | INTRAMUSCULAR | Status: AC | PRN
  Administered 2021-10-22: 100 mL via INTRAVENOUS

## 2021-10-22 MED ORDER — HYDROMORPHONE HCL 1 MG/ML IJ SOLN
1.0000 mg | Freq: Once | INTRAMUSCULAR | Status: AC
  Administered 2021-10-22: 1 mg via INTRAVENOUS
  Filled 2021-10-22: qty 1

## 2021-10-22 MED ORDER — ONDANSETRON HCL 4 MG/2ML IJ SOLN
4.0000 mg | Freq: Once | INTRAMUSCULAR | Status: AC
  Administered 2021-10-22: 4 mg via INTRAVENOUS
  Filled 2021-10-22: qty 2

## 2021-10-22 NOTE — ED Notes (Signed)
ED Provider at bedside. 

## 2021-10-22 NOTE — ED Triage Notes (Signed)
Patient arrived via POV c/o abdominal pain x 8 hrs. Patient states waves of pain to lower left quadrant from 6-10 in intensity. Patient states 1 episode of diarrhea. Patient is AO x 4, VS with elevated BP, normal gait. ?

## 2021-10-23 ENCOUNTER — Encounter (HOSPITAL_BASED_OUTPATIENT_CLINIC_OR_DEPARTMENT_OTHER): Payer: Self-pay | Admitting: Emergency Medicine

## 2021-10-23 MED ORDER — DICYCLOMINE HCL 20 MG PO TABS: 20.0000 mg | ORAL_TABLET | Freq: Two times a day (BID) | ORAL | 0 refills | Status: DC

## 2021-10-23 MED ORDER — ONDANSETRON 4 MG PO TBDP: ORAL_TABLET | ORAL | 0 refills | Status: DC

## 2021-10-23 NOTE — ED Provider Notes (Signed)
?[facility] ?Provider Note ? ? ?CSN: [id number] ?Arrival date & time: 10/22/21  2023 ? ?  ? ?History ? ?Chief Complaint  ?Patient presents with  ? Abdominal Pain  ? ? ?Ethan Roberts is a 66 y.o. male. ? ?The history is provided by the patient.  ?Abdominal Pain ?Pain location:  LLQ ?Pain quality: cramping   ?Pain radiates to:  Does not radiate ?Pain severity:  Severe ?Onset quality:  Sudden ?Duration: 8 hours. ?Timing:  Constant ?Progression:  Resolved ?Chronicity:  New ?Context: not suspicious food intake and not trauma   ?Relieved by:  Nothing ?Worsened by:  Nothing ?Associated symptoms: diarrhea and nausea   ?Associated symptoms: no dysuria and no fever   ?Risk factors: has not had multiple surgeries   ? ?  ? ?Home Medications ?Prior to Admission medications   ?Medication Sig Start Date End Date Taking? Authorizing Provider  ?dicyclomine (BENTYL) 20 MG tablet Take 1 tablet (20 mg total) by mouth 2 (two) times daily. 10/23/21  Yes Zanita Millman, MD  ?ondansetron (ZOFRAN-ODT) 4 MG disintegrating tablet '4mg'$  ODT q8 hours prn nausea/vomit 10/23/21  Yes Deseree Zemaitis, MD  ?Multiple Vitamin (MULTIVITAMIN) tablet Take 1 tablet by mouth daily.    [provider]  ?zolpidem (AMBIEN CR) 12.5 MG CR tablet  04/04/13   [provider]  ?   ? ?Allergies    ?Penicillins   ? ?Review of Systems   ?Review of Systems  ?Constitutional:  Negative for fever.  ?HENT:  Negative for congestion.   ?Eyes:  Negative for redness.  ?Respiratory:  Negative for wheezing and stridor.   ?Cardiovascular:  Negative for leg swelling.  ?Gastrointestinal:  Positive for abdominal pain, diarrhea and nausea.  ?Genitourinary:  Negative for dysuria.  ?Musculoskeletal:  Negative for neck stiffness.  ?Skin:  Negative for rash.  ?Neurological:  Negative for facial asymmetry.  ?Psychiatric/Behavioral:  Negative for agitation.   ?All other systems reviewed and are negative. ? ?Physical Exam ?Updated Vital Signs ?BP  117/82   Pulse 87   Temp 99.4 ?F (37.4 ?C) (Oral)   Resp 18   Ht '5\' 10"'$  (1.778 m)   Wt 88.5 kg   SpO2 94%   BMI 27.98 kg/m?  ?Physical Exam ?Vitals and nursing note reviewed.  ?Constitutional:   ?   General: He is not in acute distress. ?   Appearance: Normal appearance.  ?HENT:  ?   Head: Normocephalic and atraumatic.  ?   Nose: Nose normal.  ?Eyes:  ?   Conjunctiva/sclera: Conjunctivae normal.  ?   Pupils: Pupils are equal, round, and reactive to light.  ?Cardiovascular:  ?   Rate and Rhythm: Normal rate and regular rhythm.  ?   Pulses: Normal pulses.  ?   Heart sounds: Normal heart sounds.  ?Pulmonary:  ?   Effort: Pulmonary effort is normal.  ?   Breath sounds: Normal breath sounds.  ?Abdominal:  ?   General: Bowel sounds are normal.  ?   Palpations: Abdomen is soft. There is no mass.  ?   Tenderness: There is no abdominal tenderness. There is no guarding or rebound.  ?   Hernia: No hernia is present.  ?Musculoskeletal:     ?   General: Normal range of motion.  ?   Cervical back: Normal range of motion and neck supple.  ?Skin: ?   General: Skin is warm and dry.  ?   Capillary Refill: Capillary refill takes less than 2 seconds.  ?  Neurological:  ?   General: No focal deficit present.  ?   Mental Status: He is alert and oriented to person, place, and time.  ?   Deep Tendon Reflexes: Reflexes normal.  ?Psychiatric:     ?   Mood and Affect: Mood normal.     ?   Behavior: Behavior normal.  ? ? ?ED Results / Procedures / Treatments   ?Labs ?(all labs ordered are listed, but only abnormal results are displayed) ?Results for orders placed or performed during the hospital encounter of 10/22/21  ?Comprehensive metabolic panel  ?Result Value Ref Range  ? Sodium 137 135 - 145 mmol/L  ? Potassium 4.1 3.5 - 5.1 mmol/L  ? Chloride 102 98 - 111 mmol/L  ? CO2 25 22 - 32 mmol/L  ? Glucose, Bld 152 (H) 70 - 99 mg/dL  ? BUN 22 8 - 23 mg/dL  ? Creatinine, Ser 1.07 0.61 - 1.24 mg/dL  ? Calcium 9.1 8.9 - 10.3 mg/dL  ? Total  Protein 7.5 6.5 - 8.1 g/dL  ? Albumin 4.3 3.5 - 5.0 g/dL  ? AST 28 15 - 41 U/L  ? ALT 27 0 - 44 U/L  ? Alkaline Phosphatase 69 38 - 126 U/L  ? Total Bilirubin 0.6 0.3 - 1.2 mg/dL  ? GFR, Estimated >60 >60 mL/min  ? Anion gap 10 5 - 15  ?Lipase, blood  ?Result Value Ref Range  ? Lipase 27 11 - 51 U/L  ?CBC with Differential  ?Result Value Ref Range  ? WBC 11.2 (H) 4.0 - 10.5 K/uL  ? RBC 5.49 4.22 - 5.81 MIL/uL  ? Hemoglobin 16.3 13.0 - 17.0 g/dL  ? HCT 47.2 39.0 - 52.0 %  ? MCV 86.0 80.0 - 100.0 fL  ? MCH 29.7 26.0 - 34.0 pg  ? MCHC 34.5 30.0 - 36.0 g/dL  ? RDW 12.7 11.5 - 15.5 %  ? Platelets 359 150 - 400 K/uL  ? nRBC 0.0 0.0 - 0.2 %  ? Neutrophils Relative % 83 %  ? Neutro Abs 9.4 (H) 1.7 - 7.7 K/uL  ? Lymphocytes Relative 10 %  ? Lymphs Abs 1.1 0.7 - 4.0 K/uL  ? Monocytes Relative 4 %  ? Monocytes Absolute 0.4 0.1 - 1.0 K/uL  ? Eosinophils Relative 1 %  ? Eosinophils Absolute 0.1 0.0 - 0.5 K/uL  ? Basophils Relative 1 %  ? Basophils Absolute 0.1 0.0 - 0.1 K/uL  ? Immature Granulocytes 1 %  ? Abs Immature Granulocytes 0.06 0.00 - 0.07 K/uL  ?Urinalysis, Routine w reflex microscopic Urine, Clean Catch  ?Result Value Ref Range  ? Color, Urine YELLOW YELLOW  ? APPearance CLEAR CLEAR  ? Specific Gravity, Urine <=1.005 1.005 - 1.030  ? pH 6.5 5.0 - 8.0  ? Glucose, UA NEGATIVE NEGATIVE mg/dL  ? Hgb urine dipstick NEGATIVE NEGATIVE  ? Bilirubin Urine NEGATIVE NEGATIVE  ? Ketones, ur 40 (A) NEGATIVE mg/dL  ? Protein, ur NEGATIVE NEGATIVE mg/dL  ? Nitrite NEGATIVE NEGATIVE  ? Leukocytes,Ua NEGATIVE NEGATIVE  ? ?CT Abdomen Pelvis W Contrast ? ?Result Date: 10/22/2021 ?CLINICAL DATA:  Left lower quadrant abdominal pain EXAM: CT ABDOMEN AND PELVIS WITH CONTRAST TECHNIQUE: Multidetector CT imaging of the abdomen and pelvis was performed using the standard protocol following bolus administration of intravenous contrast. RADIATION DOSE REDUCTION: This exam was performed according to the departmental dose-optimization program which  includes automated exposure control, adjustment of the mA and/or kV according to patient size and/or use  of iterative reconstruction technique. CONTRAST:  160m OMNIPAQUE IOHEXOL 300 MG/ML  SOLN COMPARISON:  None. FINDINGS: Lower chest: Lung bases are clear. Hepatobiliary: Numerous scattered small hepatic cysts, measuring up to 14 mm in the anterior right hepatic lobe (series 2/image 17), benign. Gallbladder is unremarkable. No intrahepatic or extrahepatic ductal dilatation. Pancreas: Within normal limits. Spleen: Within normal limits. Adrenals/Urinary Tract: Adrenal glands are within normal limits. Kidneys are within normal limits.  No hydronephrosis. Bladder is within normal limits. Stomach/Bowel: Stomach is within normal limits. Mildly prominent loops of small bowel in the left mid abdomen (series 2/image 50), gradually tapering with small bowel stasis (series 2/image 61), with decompressed loops in the right lower quadrant (series 2/image 63). Given the lack of a discrete transition, small bowel obstruction is considered less likely, with small bowel enteritis or ileus favored. Normal appendix (series 2/image 31). No colonic wall thickening or inflammatory changes. Vascular/Lymphatic: No evidence of abdominal aortic aneurysm. Atherosclerotic calcifications of the abdominal aorta and branch vessels. No suspicious abdominopelvic lymphadenopathy. Reproductive: Prostate is unremarkable. Other: No abdominopelvic ascites. Musculoskeletal: Degenerative changes of the visualized thoracolumbar spine. IMPRESSION: Dilated loop of small bowel in the left mid abdomen, favoring small bowel enteritis or ileus over small bowel obstruction. Normal appendix. Electronically Signed   By: SJulian HyM.D.   On: 10/22/2021 23:21   ?  ? ?EKG ?EKG Interpretation ? ?Date/Time:  Wednesday Jashawna Reever 26 2023 21:11:56 EDT ?Ventricular Rate:  104 ?PR Interval:  128 ?QRS Duration: 82 ?QT Interval:  322 ?QTC Calculation: 423 ?R  Axis:   17 ?Text Interpretation: Sinus tachycardia Confirmed by PDory Horn on 10/22/2021 10:59:51 PM ? ?Radiology ?CT Abdomen Pelvis W Contrast ? ?Result Date: 10/22/2021 ?CLINICAL DATA:  Left lower quadrant abdomi

## 2021-10-23 NOTE — ED Notes (Signed)
Crackers and drink provided, pt tolerated well, no nausea, vomiting or pain ?

## 2021-10-23 NOTE — ED Notes (Signed)
Pt po challenged with gingerale and saltine crackers ?

## 2021-11-07 DIAGNOSIS — Z125 Encounter for screening for malignant neoplasm of prostate: Secondary | ICD-10-CM | POA: Diagnosis not present

## 2021-11-07 DIAGNOSIS — Z1211 Encounter for screening for malignant neoplasm of colon: Secondary | ICD-10-CM | POA: Diagnosis not present

## 2021-11-07 DIAGNOSIS — Z1331 Encounter for screening for depression: Secondary | ICD-10-CM | POA: Diagnosis not present

## 2021-11-07 DIAGNOSIS — N529 Male erectile dysfunction, unspecified: Secondary | ICD-10-CM | POA: Diagnosis not present

## 2021-11-07 DIAGNOSIS — Z0000 Encounter for general adult medical examination without abnormal findings: Secondary | ICD-10-CM | POA: Diagnosis not present

## 2021-11-07 DIAGNOSIS — E7800 Pure hypercholesterolemia, unspecified: Secondary | ICD-10-CM | POA: Diagnosis not present

## 2021-11-07 DIAGNOSIS — Z1159 Encounter for screening for other viral diseases: Secondary | ICD-10-CM | POA: Diagnosis not present

## 2021-11-07 DIAGNOSIS — I10 Essential (primary) hypertension: Secondary | ICD-10-CM | POA: Diagnosis not present

## 2021-11-07 DIAGNOSIS — G4700 Insomnia, unspecified: Secondary | ICD-10-CM | POA: Diagnosis not present

## 2021-11-07 DIAGNOSIS — R7303 Prediabetes: Secondary | ICD-10-CM | POA: Diagnosis not present

## 2021-11-26 DIAGNOSIS — L821 Other seborrheic keratosis: Secondary | ICD-10-CM | POA: Diagnosis not present

## 2021-11-26 DIAGNOSIS — D1801 Hemangioma of skin and subcutaneous tissue: Secondary | ICD-10-CM | POA: Diagnosis not present

## 2021-11-26 DIAGNOSIS — D225 Melanocytic nevi of trunk: Secondary | ICD-10-CM | POA: Diagnosis not present

## 2021-11-26 DIAGNOSIS — L570 Actinic keratosis: Secondary | ICD-10-CM | POA: Diagnosis not present

## 2021-11-26 DIAGNOSIS — L578 Other skin changes due to chronic exposure to nonionizing radiation: Secondary | ICD-10-CM | POA: Diagnosis not present

## 2021-11-26 DIAGNOSIS — D2272 Melanocytic nevi of left lower limb, including hip: Secondary | ICD-10-CM | POA: Diagnosis not present

## 2021-11-26 DIAGNOSIS — L812 Freckles: Secondary | ICD-10-CM | POA: Diagnosis not present

## 2021-11-26 DIAGNOSIS — D2262 Melanocytic nevi of left upper limb, including shoulder: Secondary | ICD-10-CM | POA: Diagnosis not present

## 2021-12-12 DIAGNOSIS — Z23 Encounter for immunization: Secondary | ICD-10-CM | POA: Diagnosis not present

## 2021-12-22 DIAGNOSIS — H2513 Age-related nuclear cataract, bilateral: Secondary | ICD-10-CM | POA: Diagnosis not present

## 2021-12-22 DIAGNOSIS — R7303 Prediabetes: Secondary | ICD-10-CM | POA: Diagnosis not present

## 2021-12-22 DIAGNOSIS — H43393 Other vitreous opacities, bilateral: Secondary | ICD-10-CM | POA: Diagnosis not present

## 2021-12-22 DIAGNOSIS — H5203 Hypermetropia, bilateral: Secondary | ICD-10-CM | POA: Diagnosis not present

## 2021-12-22 DIAGNOSIS — H25013 Cortical age-related cataract, bilateral: Secondary | ICD-10-CM | POA: Diagnosis not present

## 2022-04-03 DIAGNOSIS — R059 Cough, unspecified: Secondary | ICD-10-CM | POA: Diagnosis not present

## 2022-04-03 DIAGNOSIS — J019 Acute sinusitis, unspecified: Secondary | ICD-10-CM | POA: Diagnosis not present

## 2022-04-03 DIAGNOSIS — G4700 Insomnia, unspecified: Secondary | ICD-10-CM | POA: Diagnosis not present

## 2022-05-29 IMAGING — CT CT ABD-PELV W/ CM
2 of 5 series · 16 of 46 positions shown, 18 images · IV contrast (Omnipaque)
Comparison: None.

CLINICAL DATA: Left lower quadrant abdominal pain

EXAM:
CT ABDOMEN AND PELVIS WITH CONTRAST
TECHNIQUE: Multidetector CT imaging of the abdomen and pelvis was performed
using the standard protocol following bolus administration of
intravenous contrast.

[Series 2: axial st · axial · 0.91mm/px · z∈[+814,+1274]mm · 13 of 104 slices shown, 15 images]
[im 6/104  soft-tissue]
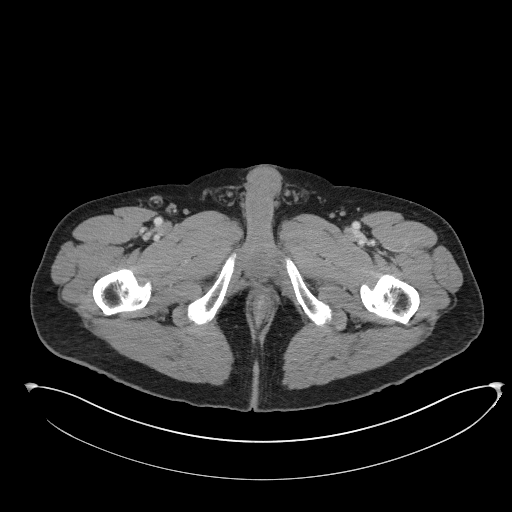
[im 6/104  bone]
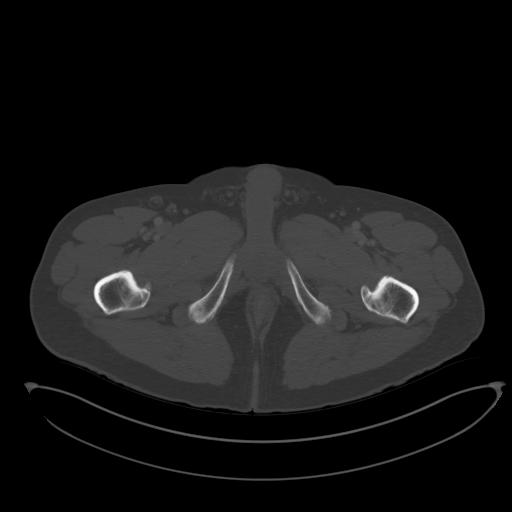
[im 16/104  soft-tissue]
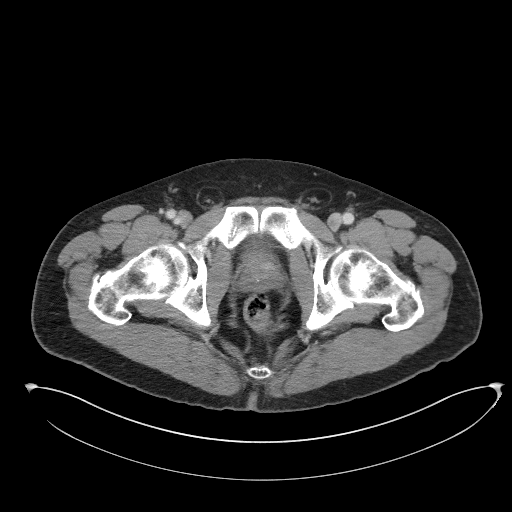
[im 21/104  soft-tissue]
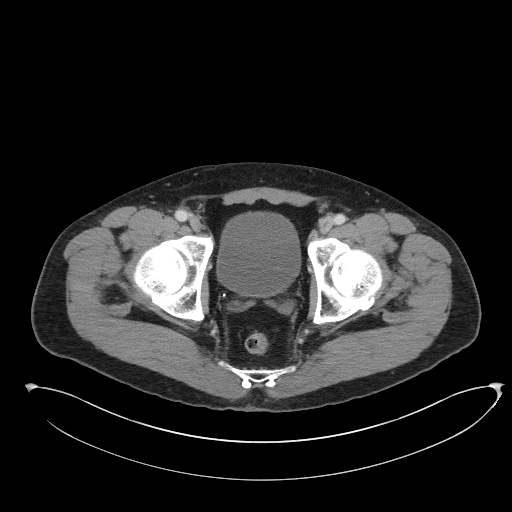
[im 31/104  soft-tissue]
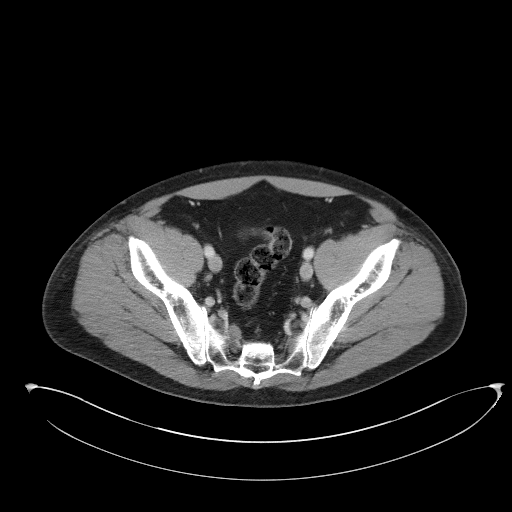
[im 37/104  soft-tissue]
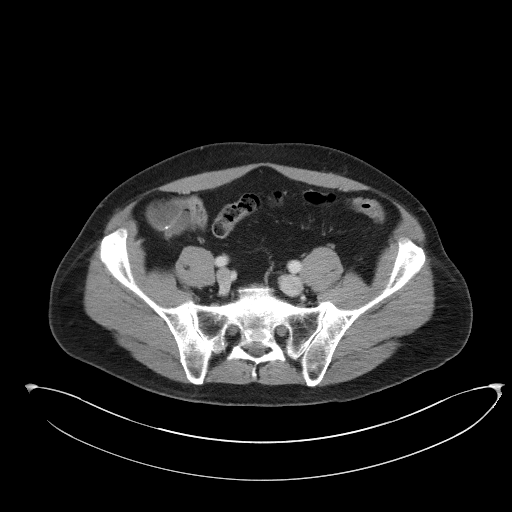
[im 47/104  soft-tissue]
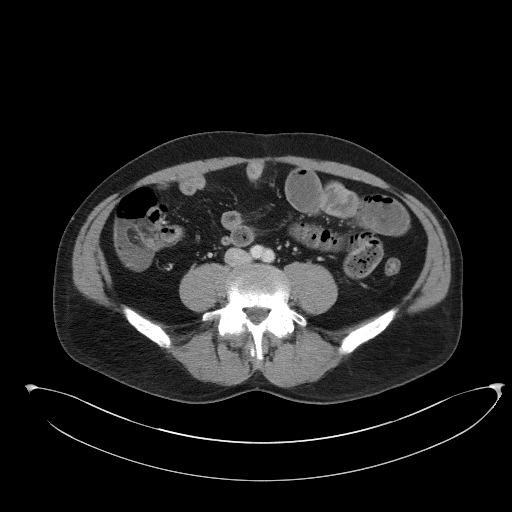
[im 52/104  soft-tissue]
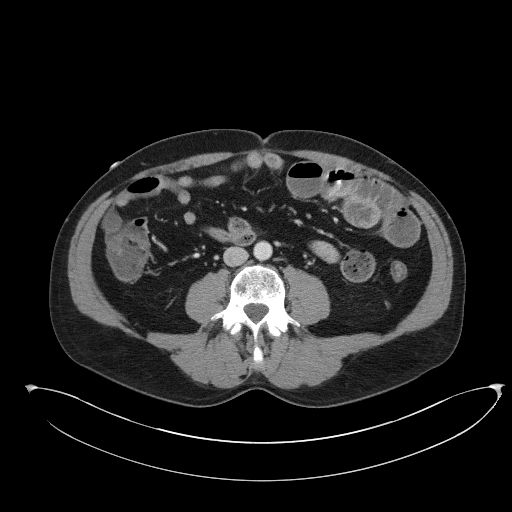
[im 57/104  soft-tissue]
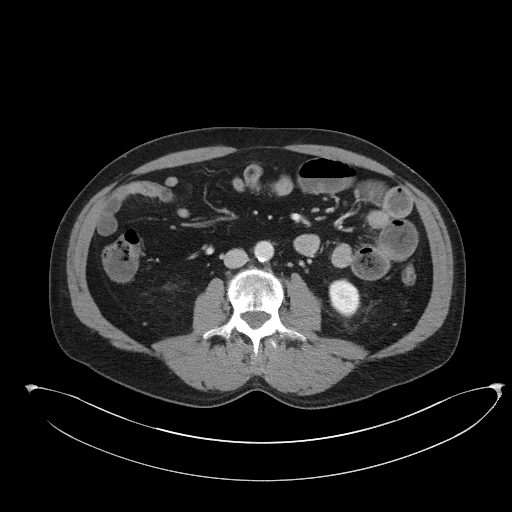
[im 67/104  soft-tissue]
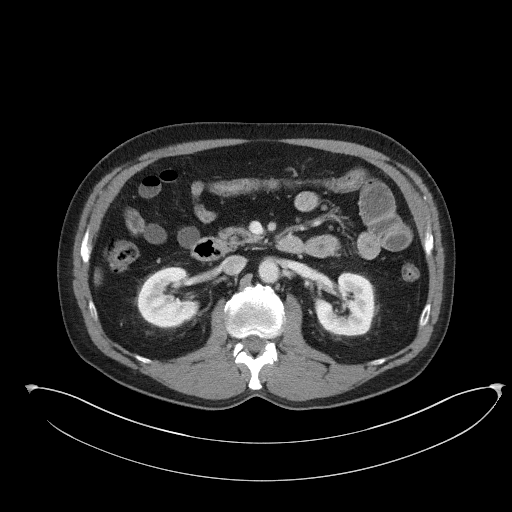
[im 67/104  bone]
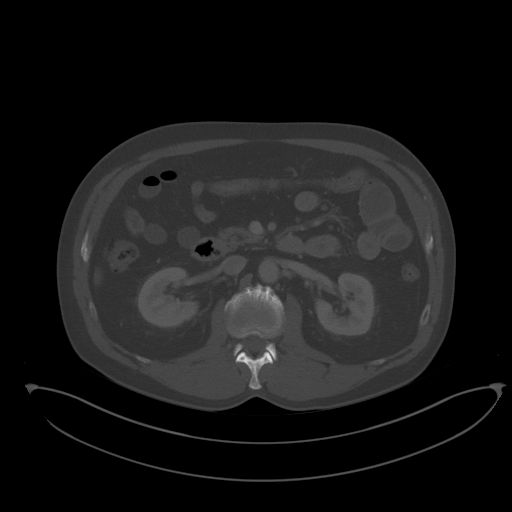
[im 73/104  soft-tissue]
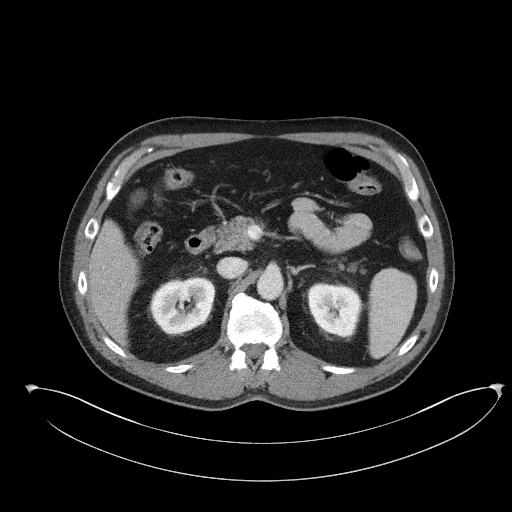
[im 83/104  soft-tissue]
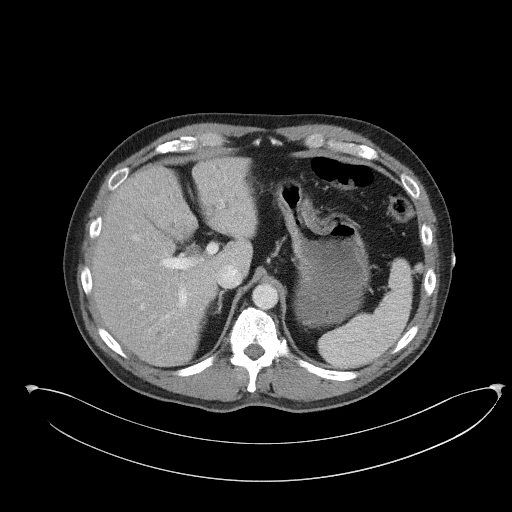
[im 88/104  soft-tissue]
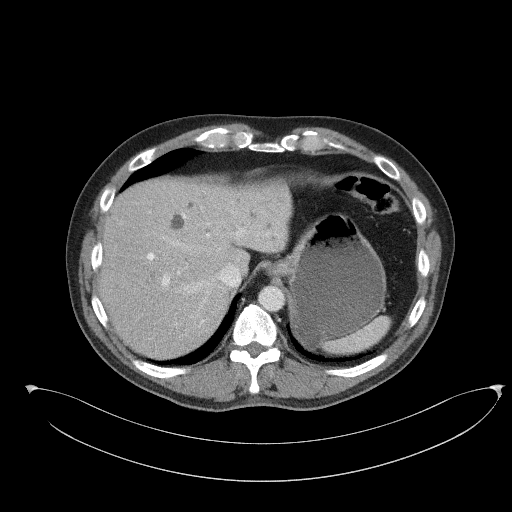
[im 98/104  soft-tissue]
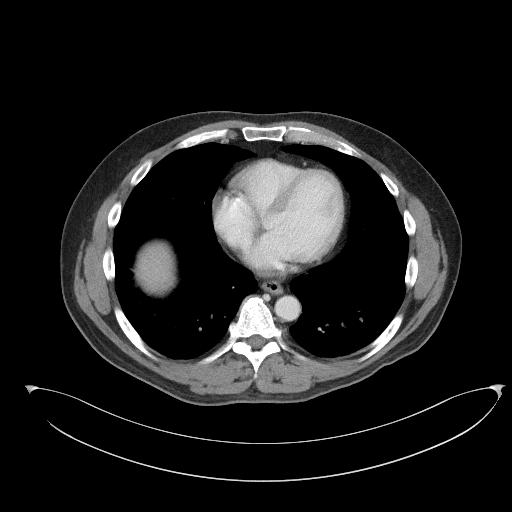

[Series 5: coronal st · coronal · 0.81mm/px · 3 of 101 slices shown]
[im 34/101  soft-tissue]
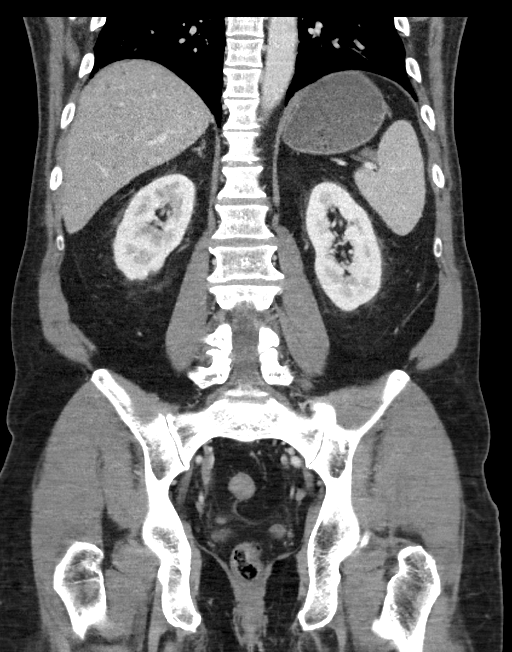
[im 45/101  soft-tissue]
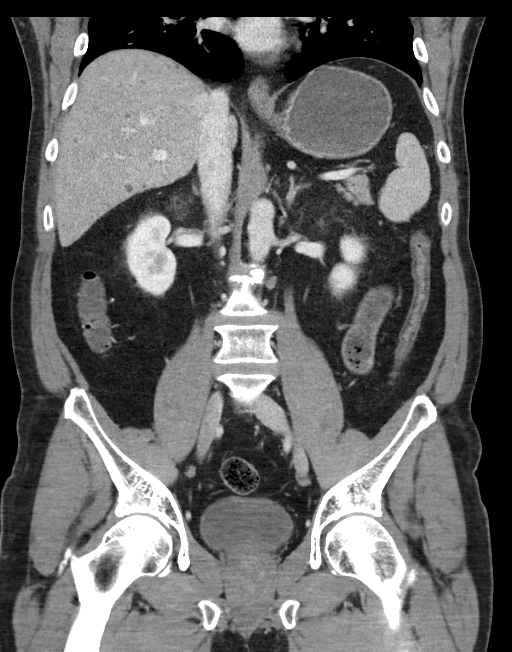
[im 56/101  soft-tissue]
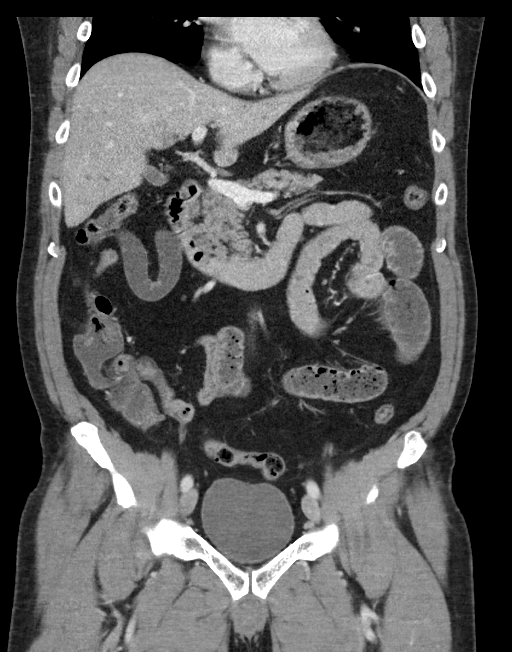

[16 of 46 positions shown; findings below may reference images not displayed]

RADIATION DOSE REDUCTION: This exam was performed according to the
departmental dose-optimization program which includes automated
exposure control, adjustment of the mA and/or kV according to
patient size and/or use of iterative reconstruction technique.

CONTRAST:  100mL OMNIPAQUE IOHEXOL 300 MG/ML  SOLN
FINDINGS: Lower chest: Lung bases are clear.

Hepatobiliary: Numerous scattered small hepatic cysts, measuring up
to 14 mm in the anterior right hepatic lobe (series 2/image 17),
benign.

Gallbladder is unremarkable. No intrahepatic or extrahepatic ductal
dilatation.

Pancreas: Within normal limits.

Spleen: Within normal limits.

Adrenals/Urinary Tract: Adrenal glands are within normal limits.

Kidneys are within normal limits.  No hydronephrosis.

Bladder is within normal limits.

Stomach/Bowel: Stomach is within normal limits.

Mildly prominent loops of small bowel in the left mid abdomen
(series 2/image 50), gradually tapering with small bowel stasis
(series 2/image 61), with decompressed loops in the right lower
quadrant (series 2/image 63). Given the lack of a discrete
transition, small bowel obstruction is considered less likely, with
small bowel enteritis or ileus favored.

Normal appendix (series 2/image 31).

No colonic wall thickening or inflammatory changes.

Vascular/Lymphatic: No evidence of abdominal aortic aneurysm.

Atherosclerotic calcifications of the abdominal aorta and branch
vessels.

No suspicious abdominopelvic lymphadenopathy.

Reproductive: Prostate is unremarkable.

Other: No abdominopelvic ascites.

Musculoskeletal: Degenerative changes of the visualized
thoracolumbar spine.
IMPRESSION: Dilated loop of small bowel in the left mid abdomen, favoring small
bowel enteritis or ileus over small bowel obstruction.

Normal appendix.

## 2022-06-02 DIAGNOSIS — D2261 Melanocytic nevi of right upper limb, including shoulder: Secondary | ICD-10-CM | POA: Diagnosis not present

## 2022-06-02 DIAGNOSIS — L812 Freckles: Secondary | ICD-10-CM | POA: Diagnosis not present

## 2022-06-02 DIAGNOSIS — L821 Other seborrheic keratosis: Secondary | ICD-10-CM | POA: Diagnosis not present

## 2022-06-02 DIAGNOSIS — D2271 Melanocytic nevi of right lower limb, including hip: Secondary | ICD-10-CM | POA: Diagnosis not present

## 2022-06-02 DIAGNOSIS — D1801 Hemangioma of skin and subcutaneous tissue: Secondary | ICD-10-CM | POA: Diagnosis not present

## 2022-06-02 DIAGNOSIS — Z85820 Personal history of malignant melanoma of skin: Secondary | ICD-10-CM | POA: Diagnosis not present

## 2022-06-02 DIAGNOSIS — D2272 Melanocytic nevi of left lower limb, including hip: Secondary | ICD-10-CM | POA: Diagnosis not present

## 2022-06-02 DIAGNOSIS — I788 Other diseases of capillaries: Secondary | ICD-10-CM | POA: Diagnosis not present

## 2022-06-02 DIAGNOSIS — L565 Disseminated superficial actinic porokeratosis (DSAP): Secondary | ICD-10-CM | POA: Diagnosis not present

## 2022-06-02 DIAGNOSIS — D2262 Melanocytic nevi of left upper limb, including shoulder: Secondary | ICD-10-CM | POA: Diagnosis not present

## 2022-06-02 DIAGNOSIS — D225 Melanocytic nevi of trunk: Secondary | ICD-10-CM | POA: Diagnosis not present

## 2022-06-18 DIAGNOSIS — R059 Cough, unspecified: Secondary | ICD-10-CM | POA: Diagnosis not present

## 2022-06-18 DIAGNOSIS — J019 Acute sinusitis, unspecified: Secondary | ICD-10-CM | POA: Diagnosis not present

## 2022-12-11 DIAGNOSIS — J329 Chronic sinusitis, unspecified: Secondary | ICD-10-CM | POA: Diagnosis not present

## 2022-12-11 DIAGNOSIS — H6590 Unspecified nonsuppurative otitis media, unspecified ear: Secondary | ICD-10-CM | POA: Diagnosis not present

## 2022-12-24 DIAGNOSIS — D225 Melanocytic nevi of trunk: Secondary | ICD-10-CM | POA: Diagnosis not present

## 2022-12-24 DIAGNOSIS — L821 Other seborrheic keratosis: Secondary | ICD-10-CM | POA: Diagnosis not present

## 2022-12-24 DIAGNOSIS — Z85828 Personal history of other malignant neoplasm of skin: Secondary | ICD-10-CM | POA: Diagnosis not present

## 2022-12-24 DIAGNOSIS — L565 Disseminated superficial actinic porokeratosis (DSAP): Secondary | ICD-10-CM | POA: Diagnosis not present

## 2022-12-24 DIAGNOSIS — L570 Actinic keratosis: Secondary | ICD-10-CM | POA: Diagnosis not present

## 2022-12-24 DIAGNOSIS — L812 Freckles: Secondary | ICD-10-CM | POA: Diagnosis not present

## 2023-01-01 DIAGNOSIS — H6593 Unspecified nonsuppurative otitis media, bilateral: Secondary | ICD-10-CM | POA: Diagnosis not present

## 2023-01-22 DIAGNOSIS — H25013 Cortical age-related cataract, bilateral: Secondary | ICD-10-CM | POA: Diagnosis not present

## 2023-01-22 DIAGNOSIS — H43393 Other vitreous opacities, bilateral: Secondary | ICD-10-CM | POA: Diagnosis not present

## 2023-01-22 DIAGNOSIS — H2513 Age-related nuclear cataract, bilateral: Secondary | ICD-10-CM | POA: Diagnosis not present

## 2023-01-22 DIAGNOSIS — H524 Presbyopia: Secondary | ICD-10-CM | POA: Diagnosis not present

## 2023-01-22 DIAGNOSIS — R7303 Prediabetes: Secondary | ICD-10-CM | POA: Diagnosis not present

## 2023-04-30 DIAGNOSIS — R7303 Prediabetes: Secondary | ICD-10-CM | POA: Diagnosis not present

## 2023-04-30 DIAGNOSIS — E7800 Pure hypercholesterolemia, unspecified: Secondary | ICD-10-CM | POA: Diagnosis not present

## 2023-04-30 DIAGNOSIS — Z23 Encounter for immunization: Secondary | ICD-10-CM | POA: Diagnosis not present

## 2023-04-30 DIAGNOSIS — Z1211 Encounter for screening for malignant neoplasm of colon: Secondary | ICD-10-CM | POA: Diagnosis not present

## 2023-04-30 DIAGNOSIS — I10 Essential (primary) hypertension: Secondary | ICD-10-CM | POA: Diagnosis not present

## 2023-04-30 DIAGNOSIS — Z0000 Encounter for general adult medical examination without abnormal findings: Secondary | ICD-10-CM | POA: Diagnosis not present

## 2023-04-30 DIAGNOSIS — N529 Male erectile dysfunction, unspecified: Secondary | ICD-10-CM | POA: Diagnosis not present

## 2023-04-30 DIAGNOSIS — G4700 Insomnia, unspecified: Secondary | ICD-10-CM | POA: Diagnosis not present

## 2023-04-30 DIAGNOSIS — Z1331 Encounter for screening for depression: Secondary | ICD-10-CM | POA: Diagnosis not present

## 2023-04-30 DIAGNOSIS — Z125 Encounter for screening for malignant neoplasm of prostate: Secondary | ICD-10-CM | POA: Diagnosis not present

## 2023-07-05 DIAGNOSIS — L565 Disseminated superficial actinic porokeratosis (DSAP): Secondary | ICD-10-CM | POA: Diagnosis not present

## 2023-07-05 DIAGNOSIS — D2271 Melanocytic nevi of right lower limb, including hip: Secondary | ICD-10-CM | POA: Diagnosis not present

## 2023-07-05 DIAGNOSIS — L578 Other skin changes due to chronic exposure to nonionizing radiation: Secondary | ICD-10-CM | POA: Diagnosis not present

## 2023-07-05 DIAGNOSIS — D225 Melanocytic nevi of trunk: Secondary | ICD-10-CM | POA: Diagnosis not present

## 2023-07-05 DIAGNOSIS — L821 Other seborrheic keratosis: Secondary | ICD-10-CM | POA: Diagnosis not present

## 2023-07-05 DIAGNOSIS — L820 Inflamed seborrheic keratosis: Secondary | ICD-10-CM | POA: Diagnosis not present

## 2023-07-05 DIAGNOSIS — D2371 Other benign neoplasm of skin of right lower limb, including hip: Secondary | ICD-10-CM | POA: Diagnosis not present

## 2023-07-05 DIAGNOSIS — L239 Allergic contact dermatitis, unspecified cause: Secondary | ICD-10-CM | POA: Diagnosis not present

## 2023-07-05 DIAGNOSIS — L812 Freckles: Secondary | ICD-10-CM | POA: Diagnosis not present

## 2023-11-16 DIAGNOSIS — G4700 Insomnia, unspecified: Secondary | ICD-10-CM | POA: Diagnosis not present

## 2023-11-16 DIAGNOSIS — M5430 Sciatica, unspecified side: Secondary | ICD-10-CM | POA: Diagnosis not present

## 2023-11-16 DIAGNOSIS — I10 Essential (primary) hypertension: Secondary | ICD-10-CM | POA: Diagnosis not present

## 2023-11-24 ENCOUNTER — Other Ambulatory Visit (HOSPITAL_COMMUNITY): Payer: Self-pay | Admitting: Medical

## 2023-11-24 DIAGNOSIS — M79605 Pain in left leg: Secondary | ICD-10-CM

## 2023-11-24 DIAGNOSIS — M79662 Pain in left lower leg: Secondary | ICD-10-CM | POA: Diagnosis not present

## 2023-11-25 ENCOUNTER — Ambulatory Visit (HOSPITAL_COMMUNITY)
Admission: RE | Admit: 2023-11-25 | Discharge: 2023-11-25 | Disposition: A | Discharge status: Home / Self Care | Payer: Self-pay | Source: Ambulatory Visit | Attending: Medical | Admitting: Medical

## 2023-11-25 ENCOUNTER — Other Ambulatory Visit (HOSPITAL_COMMUNITY): Payer: Self-pay

## 2023-11-25 ENCOUNTER — Encounter: Payer: Self-pay | Admitting: Student-PharmD

## 2023-11-25 ENCOUNTER — Ambulatory Visit: Attending: Vascular Surgery | Admitting: Student-PharmD

## 2023-11-25 VITALS — BP 122/76 | HR 74 | Wt 198.0 lb

## 2023-11-25 DIAGNOSIS — M79605 Pain in left leg: Secondary | ICD-10-CM

## 2023-11-25 DIAGNOSIS — I82452 Acute embolism and thrombosis of left peroneal vein: Secondary | ICD-10-CM

## 2023-11-25 MED ORDER — APIXABAN 5 MG PO TABS
5.0000 mg | ORAL_TABLET | Freq: Two times a day (BID) | ORAL | 1 refills | Status: DC
Start: 2023-11-25 — End: 2024-03-01

## 2023-11-25 MED ORDER — APIXABAN (ELIQUIS) VTE STARTER PACK (10MG AND 5MG)
ORAL_TABLET | ORAL | 0 refills | Status: DC
  Filled 2023-11-25: qty 74, 30d supply, fill #0

## 2023-11-25 NOTE — Patient Instructions (Signed)
-  Start apixaban (Eliquis) 10 mg twice daily for 7 days followed by 5 mg twice daily. -Your refills have been sent to your [facility]. You may need to call the pharmacy to ask them to fill this when you start to run low on your current supply.  -It is important to take your medication around the same time every day.  -Avoid NSAIDs like ibuprofen (Advil, Motrin) and naproxen (Aleve) as well as aspirin doses over 100 mg daily. -Tylenol (acetaminophen) is the preferred over the counter pain medication to lower the risk of bleeding. -Be sure to alert all of your health care providers that you are taking an anticoagulant prior to starting a new medication or having a procedure. -Monitor for signs and symptoms of bleeding (abnormal bruising, prolonged bleeding, nose bleeds, bleeding from gums, discolored urine, black tarry stools). If you have fallen and hit your head OR if your bleeding is severe or not stopping, seek emergency care.  -Go to the emergency room if emergent signs and symptoms of new clot occur (new or worse swelling and pain in an arm or leg, shortness of breath, chest pain, fast or irregular heartbeats, lightheadedness, dizziness, fainting, coughing up blood) or if you experience a significant color change (pale or blue) in the extremity that has the DVT.  -We recommend you wear compression stockings (20-30 mmHg) as long as you are having swelling or pain. Be sure to purchase the correct size and take them off at night.   If you have any questions or need to reschedule an appointment, please call [phone]. If you are having an emergency, call 911 or present to the nearest emergency room.   What is a DVT?  -Deep vein thrombosis (DVT) is a condition in which a blood clot forms in a vein of the deep venous system which can occur in the lower leg, thigh, pelvis, arm, or neck. This condition is serious and can be life-threatening if the clot travels to the arteries of the lungs and causing a  blockage (pulmonary embolism, PE). A DVT can also damage veins in the leg, which can lead to long-term venous disease, leg pain, swelling, discoloration, and ulcers or sores (post-thrombotic syndrome).  -Treatment may include taking an anticoagulant medication to prevent more clots from forming and the current clot from growing, wearing compression stockings, and/or surgical procedures to remove or dissolve the clot.
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
## 2024-01-06 DIAGNOSIS — D225 Melanocytic nevi of trunk: Secondary | ICD-10-CM | POA: Diagnosis not present

## 2024-01-06 DIAGNOSIS — L565 Disseminated superficial actinic porokeratosis (DSAP): Secondary | ICD-10-CM | POA: Diagnosis not present

## 2024-01-06 DIAGNOSIS — L821 Other seborrheic keratosis: Secondary | ICD-10-CM | POA: Diagnosis not present

## 2024-01-06 DIAGNOSIS — L812 Freckles: Secondary | ICD-10-CM | POA: Diagnosis not present

## 2024-01-06 DIAGNOSIS — D2372 Other benign neoplasm of skin of left lower limb, including hip: Secondary | ICD-10-CM | POA: Diagnosis not present

## 2024-01-06 DIAGNOSIS — D2261 Melanocytic nevi of right upper limb, including shoulder: Secondary | ICD-10-CM | POA: Diagnosis not present

## 2024-01-31 DIAGNOSIS — H43393 Other vitreous opacities, bilateral: Secondary | ICD-10-CM | POA: Diagnosis not present

## 2024-01-31 DIAGNOSIS — H25013 Cortical age-related cataract, bilateral: Secondary | ICD-10-CM | POA: Diagnosis not present

## 2024-01-31 DIAGNOSIS — H524 Presbyopia: Secondary | ICD-10-CM | POA: Diagnosis not present

## 2024-01-31 DIAGNOSIS — H2513 Age-related nuclear cataract, bilateral: Secondary | ICD-10-CM | POA: Diagnosis not present

## 2024-01-31 DIAGNOSIS — R7303 Prediabetes: Secondary | ICD-10-CM | POA: Diagnosis not present

## 2024-03-01 ENCOUNTER — Encounter: Payer: Self-pay | Admitting: Student-PharmD

## 2024-03-01 ENCOUNTER — Ambulatory Visit: Attending: Vascular Surgery | Admitting: Student-PharmD

## 2024-03-01 VITALS — Wt 195.0 lb

## 2024-03-01 DIAGNOSIS — I82452 Acute embolism and thrombosis of left peroneal vein: Secondary | ICD-10-CM | POA: Diagnosis not present

## 2024-03-01 NOTE — Patient Instructions (Signed)
 You have been discharged from the DVT Clinic! No further follow up in the DVT Clinic is needed.  -Stop taking Eliquis  as you have completed your 3 months of treatment.  -Elevate your legs as needed for swelling. You can use compression stockings (tighter: 20-30 mmHg; less tight: 15-20 mmHg) as needed as well.   Please reach out if any questions come up at [phone].
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
## 2024-05-23 DIAGNOSIS — R202 Paresthesia of skin: Secondary | ICD-10-CM | POA: Diagnosis not present

## 2024-05-23 DIAGNOSIS — I10 Essential (primary) hypertension: Secondary | ICD-10-CM | POA: Diagnosis not present

## 2024-05-23 DIAGNOSIS — N529 Male erectile dysfunction, unspecified: Secondary | ICD-10-CM | POA: Diagnosis not present

## 2024-05-23 DIAGNOSIS — Z0000 Encounter for general adult medical examination without abnormal findings: Secondary | ICD-10-CM | POA: Diagnosis not present

## 2024-05-23 DIAGNOSIS — R7303 Prediabetes: Secondary | ICD-10-CM | POA: Diagnosis not present

## 2024-05-23 DIAGNOSIS — Z1331 Encounter for screening for depression: Secondary | ICD-10-CM | POA: Diagnosis not present

## 2024-05-23 DIAGNOSIS — Z125 Encounter for screening for malignant neoplasm of prostate: Secondary | ICD-10-CM | POA: Diagnosis not present

## 2024-05-23 DIAGNOSIS — Z23 Encounter for immunization: Secondary | ICD-10-CM | POA: Diagnosis not present

## 2024-05-23 DIAGNOSIS — G4700 Insomnia, unspecified: Secondary | ICD-10-CM | POA: Diagnosis not present

## 2024-05-23 DIAGNOSIS — E7800 Pure hypercholesterolemia, unspecified: Secondary | ICD-10-CM | POA: Diagnosis not present

## 2024-05-23 DIAGNOSIS — Z86718 Personal history of other venous thrombosis and embolism: Secondary | ICD-10-CM | POA: Diagnosis not present

## 2024-05-24 ENCOUNTER — Other Ambulatory Visit (HOSPITAL_COMMUNITY): Payer: Self-pay | Admitting: Family Medicine

## 2024-05-24 DIAGNOSIS — E7800 Pure hypercholesterolemia, unspecified: Secondary | ICD-10-CM

## 2024-06-05 DIAGNOSIS — J069 Acute upper respiratory infection, unspecified: Secondary | ICD-10-CM | POA: Diagnosis not present

## 2024-06-08 ENCOUNTER — Ambulatory Visit (HOSPITAL_BASED_OUTPATIENT_CLINIC_OR_DEPARTMENT_OTHER)
Admission: RE | Admit: 2024-06-08 | Discharge: 2024-06-08 | Disposition: A | Discharge status: Home / Self Care | Payer: Self-pay | Source: Ambulatory Visit | Attending: Family Medicine | Admitting: Family Medicine

## 2024-06-08 DIAGNOSIS — E7800 Pure hypercholesterolemia, unspecified: Secondary | ICD-10-CM
